# Patient Record
Sex: Female | Born: 1990 | Race: Black or African American | Hispanic: No | Smoking: Never smoker
Health system: Southern US, Community
[De-identification: ages and names within clinical notes are randomized; demographics above are authoritative.]

---

## 2014-08-02 ENCOUNTER — Emergency Department (HOSPITAL_COMMUNITY): Payer: Self-pay

## 2014-08-02 ENCOUNTER — Emergency Department (HOSPITAL_COMMUNITY)
Admission: EM | Admit: 2014-08-02 | Discharge: 2014-08-02 | Disposition: A | Discharge status: Home / Self Care | Payer: Self-pay | Attending: Emergency Medicine | Admitting: Emergency Medicine

## 2014-08-02 ENCOUNTER — Encounter (HOSPITAL_COMMUNITY): Payer: Self-pay | Admitting: Emergency Medicine

## 2014-08-02 DIAGNOSIS — Z3490 Encounter for supervision of normal pregnancy, unspecified, unspecified trimester: Secondary | ICD-10-CM

## 2014-08-02 DIAGNOSIS — O210 Mild hyperemesis gravidarum: Secondary | ICD-10-CM | POA: Insufficient documentation

## 2014-08-02 DIAGNOSIS — O9989 Other specified diseases and conditions complicating pregnancy, childbirth and the puerperium: Secondary | ICD-10-CM | POA: Insufficient documentation

## 2014-08-02 DIAGNOSIS — R1084 Generalized abdominal pain: Secondary | ICD-10-CM | POA: Insufficient documentation

## 2014-08-02 DIAGNOSIS — R197 Diarrhea, unspecified: Secondary | ICD-10-CM | POA: Insufficient documentation

## 2014-08-02 DIAGNOSIS — R112 Nausea with vomiting, unspecified: Secondary | ICD-10-CM

## 2014-08-02 LAB — COMPREHENSIVE METABOLIC PANEL
ALBUMIN: 3.9 g/dL (ref 3.5–5.2)
ALT: 8 U/L (ref 0–35)
ANION GAP: 15 (ref 5–15)
AST: 16 U/L (ref 0–37)
Alkaline Phosphatase: 32 U/L — ABNORMAL LOW (ref 39–117)
BILIRUBIN TOTAL: 0.4 mg/dL (ref 0.3–1.2)
BUN: 7 mg/dL (ref 6–23)
CO2: 21 mEq/L (ref 19–32)
CREATININE: 0.68 mg/dL (ref 0.50–1.10)
Calcium: 9.5 mg/dL (ref 8.4–10.5)
Chloride: 100 mEq/L (ref 96–112)
GFR calc Af Amer: >90 mL/min (ref >90)
GFR calc non Af Amer: >90 mL/min (ref >90)
Glucose, Bld: 100 mg/dL — ABNORMAL HIGH (ref 70–99)
Potassium: 3.8 mEq/L (ref 3.7–5.3)
Sodium: 136 mEq/L — ABNORMAL LOW (ref 137–147)
Total Protein: 7.5 g/dL (ref 6.0–8.3)

## 2014-08-02 LAB — CBC WITH DIFFERENTIAL/PLATELET
BASOS PCT: 0 % (ref 0–1)
Basophils Absolute: 0 10*3/uL (ref 0.0–0.1)
EOS ABS: 0.1 10*3/uL (ref 0.0–0.7)
Eosinophils Relative: 1 % (ref 0–5)
HEMATOCRIT: 39.6 % (ref 36.0–46.0)
HEMOGLOBIN: 14.1 g/dL (ref 12.0–15.0)
Lymphocytes Relative: 12 % (ref 12–46)
Lymphs Abs: 0.9 10*3/uL (ref 0.7–4.0)
MCH: 30.3 pg (ref 26.0–34.0)
MCHC: 35.6 g/dL (ref 30.0–36.0)
MCV: 85 fL (ref 78.0–100.0)
MONO ABS: 0.5 10*3/uL (ref 0.1–1.0)
Monocytes Relative: 6 % (ref 3–12)
Neutro Abs: 6.5 10*3/uL (ref 1.7–7.7)
Neutrophils Relative %: 81 % — ABNORMAL HIGH (ref 43–77)
Platelets: 314 10*3/uL (ref 150–400)
RBC: 4.66 MIL/uL (ref 3.87–5.11)
RDW: 12.3 % (ref 11.5–15.5)
WBC: 8 10*3/uL (ref 4.0–10.5)

## 2014-08-02 LAB — URINALYSIS, ROUTINE W REFLEX MICROSCOPIC
Glucose, UA: NEGATIVE mg/dL
NITRITE: NEGATIVE
Protein, ur: 30 mg/dL — AB
Specific Gravity, Urine: 1.02 (ref 1.005–1.030)
Urobilinogen, UA: 0.2 mg/dL (ref 0.0–1.0)
pH: 6 (ref 5.0–8.0)

## 2014-08-02 LAB — URINE MICROSCOPIC-ADD ON: RBC / HPF: NONE SEEN RBC/hpf (ref <3)

## 2014-08-02 LAB — HCG, QUANTITATIVE, PREGNANCY: hCG, Beta Chain, Quant, S: 27205 m[IU]/mL — ABNORMAL HIGH (ref <5)

## 2014-08-02 LAB — LIPASE, BLOOD: LIPASE: 15 U/L (ref 11–59)

## 2014-08-02 LAB — POC URINE PREG, ED: Preg Test, Ur: POSITIVE — AB

## 2014-08-02 MED ORDER — PROMETHAZINE HCL 25 MG PO TABS
25.0000 mg | ORAL_TABLET | ORAL | Status: DC
Start: 2014-08-02 — End: 2014-08-02
  Filled 2014-08-02: qty 1

## 2014-08-02 MED ORDER — PROMETHAZINE HCL 25 MG/ML IJ SOLN
25.0000 mg | Freq: Once | INTRAMUSCULAR | Status: AC
  Administered 2014-08-02: 25 mg via INTRAVENOUS
  Filled 2014-08-02: qty 1

## 2014-08-02 MED ORDER — PROMETHAZINE HCL 25 MG PO TABS: 25.0000 mg | ORAL_TABLET | Freq: Four times a day (QID) | ORAL | Status: AC | PRN

## 2014-08-02 MED ORDER — SODIUM CHLORIDE 0.9 % IV BOLUS (SEPSIS)
1000.0000 mL | Freq: Once | INTRAVENOUS | Status: AC
  Administered 2014-08-02: 1000 mL via INTRAVENOUS

## 2014-08-02 NOTE — Discharge Instructions (Signed)
Please follow up with your primary care physician in 1-2 days. If you do not have one please call the [facility] and wellness Center number listed above. Please follow up with an Ob/Gyn to schedule a follow up appointment in 10-14 days to have a repeat ultrasound.   Please read all discharge instructions and return precautions.   Viral Gastroenteritis Viral gastroenteritis is also known as stomach flu. This condition affects the stomach and intestinal tract. It can cause sudden diarrhea and vomiting. The illness typically lasts 3 to 8 days. Most people develop an immune response that eventually gets rid of the virus. While this natural response develops, the virus can make you quite ill. CAUSES  Many different viruses can cause gastroenteritis, such as rotavirus or noroviruses. You can catch one of these viruses by consuming contaminated food or water. You may also catch a virus by sharing utensils or other personal items with an infected person or by touching a contaminated surface. SYMPTOMS  The most common symptoms are diarrhea and vomiting. These problems can cause a severe loss of body fluids (dehydration) and a body salt (electrolyte) imbalance. Other symptoms may include:  Fever.  Headache.  Fatigue.  Abdominal pain. DIAGNOSIS  Your caregiver can usually diagnose viral gastroenteritis based on your symptoms and a physical exam. A stool sample may also be taken to test for the presence of viruses or other infections. TREATMENT  This illness typically goes away on its own. Treatments are aimed at rehydration. The most serious cases of viral gastroenteritis involve vomiting so severely that you are not able to keep fluids down. In these cases, fluids must be given through an intravenous line (IV). HOME CARE INSTRUCTIONS   Drink enough fluids to keep your urine clear or pale yellow. Drink small amounts of fluids frequently and increase the amounts as tolerated.  Ask your caregiver for  specific rehydration instructions.  Avoid:  Foods high in sugar.  Alcohol.  Carbonated drinks.  Tobacco.  Juice.  Caffeine drinks.  Extremely hot or cold fluids.  Fatty, greasy foods.  Too much intake of anything at one time.  Dairy products until 24 to 48 hours after diarrhea stops.  You may consume probiotics. Probiotics are active cultures of beneficial bacteria. They may lessen the amount and number of diarrheal stools in adults. Probiotics can be found in yogurt with active cultures and in supplements.  Wash your hands well to avoid spreading the virus.  Only take over-the-counter or prescription medicines for pain, discomfort, or fever as directed by your caregiver. Do not give aspirin to children. Antidiarrheal medicines are not recommended.  Ask your caregiver if you should continue to take your regular prescribed and over-the-counter medicines.  Keep all follow-up appointments as directed by your caregiver. SEEK IMMEDIATE MEDICAL CARE IF:   You are unable to keep fluids down.  You do not urinate at least once every 6 to 8 hours.  You develop shortness of breath.  You notice blood in your stool or vomit. This may look like coffee grounds.  You have abdominal pain that increases or is concentrated in one small area (localized).  You have persistent vomiting or diarrhea.  You have a fever.  The patient is a child younger than 3 months, and he or she has a fever.  The patient is a child older than 3 months, and he or she has a fever and persistent symptoms.  The patient is a child older than 3 months, and he or she  has a fever and symptoms suddenly get worse.  The patient is a baby, and he or she has no tears when crying. MAKE SURE YOU:   Understand these instructions.  Will watch your condition.  Will get help right away if you are not doing well or get worse. Document Released: 11/02/2005 Document Revised: 01/25/2012 Document Reviewed:  08/19/2011 [facility] Patient Information 2015 [place], [place]. This information is not intended to replace advice given to you by your health care provider. Make sure you discuss any questions you have with your health care provider. Abdominal Pain During Pregnancy Abdominal pain is common in pregnancy. Most of the time, it does not cause harm. There are many causes of abdominal pain. Some causes are more serious than others. Some of the causes of abdominal pain in pregnancy are easily diagnosed. Occasionally, the diagnosis takes time to understand. Other times, the cause is not determined. Abdominal pain can be a sign that something is very wrong with the pregnancy, or the pain may have nothing to do with the pregnancy at all. For this reason, always tell your health care provider if you have any abdominal discomfort. HOME CARE INSTRUCTIONS  Monitor your abdominal pain for any changes. The following actions may help to alleviate any discomfort you are experiencing:  Do not have sexual intercourse or put anything in your vagina until your symptoms go away completely.  Get plenty of rest until your pain improves.  Drink clear fluids if you feel nauseous. Avoid solid food as long as you are uncomfortable or nauseous.  Only take over-the-counter or prescription medicine as directed by your health care provider.  Keep all follow-up appointments with your health care provider. SEEK IMMEDIATE MEDICAL CARE IF:  You are bleeding, leaking fluid, or passing tissue from the vagina.  You have increasing pain or cramping.  You have persistent vomiting.  You have painful or bloody urination.  You have a fever.  You notice a decrease in your baby's movements.  You have extreme weakness or feel faint.  You have shortness of breath, with or without abdominal pain.  You develop a severe headache with abdominal pain.  You have abnormal vaginal discharge with abdominal pain.  You have persistent  diarrhea.  You have abdominal pain that continues even after rest, or gets worse. MAKE SURE YOU:   Understand these instructions.  Will watch your condition.  Will get help right away if you are not doing well or get worse. Document Released: 11/02/2005 Document Revised: 08/23/2013 Document Reviewed: 06/01/2013 [facility] Patient Information 2015 [place], [place]. This information is not intended to replace advice given to you by your health care provider. Make sure you discuss any questions you have with your health care provider.

## 2014-08-02 NOTE — ED Provider Notes (Signed)
CSN: [id number]     Arrival date & time 08/02/14  1137 History   First MD Initiated Contact with Patient 08/02/14 1217     Chief Complaint  Patient presents with  . Abdominal Cramping     (Consider location/radiation/quality/duration/timing/severity/associated sxs/prior Treatment) HPI Comments: Patient is a 23 yo F presenting to the ED for four days of nausea, non-bloody non-bilious emesis, and non-bloody diarrhea with generalized abdominal cramping pain worse at nighttime. Patient states that her diarrhea and vomiting have improved today. Endorses decreased PO intake. She states she had one day of light spotting, but denies any isolated pelvic pain or cramping. LMP 06/04/14.   Patient is a 23 y.o. female presenting with cramps.  Abdominal Cramping Associated symptoms: diarrhea, nausea and vomiting     History reviewed. No pertinent past medical history. History reviewed. No pertinent past surgical history. No family history on file. History  Substance Use Topics  . Smoking status: Never Smoker   . Smokeless tobacco: Not on file  . Alcohol Use: No   OB History   Grav Para Term Preterm Abortions TAB SAB Ect Mult Living                 Review of Systems  Gastrointestinal: Positive for nausea, vomiting, abdominal pain and diarrhea.  All other systems reviewed and are negative.     Allergies  Review of patient's allergies indicates no known allergies.  Home Medications   Prior to Admission medications   Medication Sig Start Date End Date Taking? Authorizing Provider  promethazine (PHENERGAN) 25 MG tablet Take 1 tablet (25 mg total) by mouth every 6 (six) hours as needed for nausea or vomiting. 08/02/14   Victorino Dike L Jadie Allington, PA-C   BP 118/71  Pulse 97  Temp(Src) 97.6 F (36.4 C) (Oral)  Resp 18  Ht  (1.727 m)  Wt 135 lb (61.236 kg)  BMI 20.53 kg/m2  SpO2 100%  LMP 06/04/2014 Physical Exam  Nursing note and vitals reviewed. Constitutional: She is oriented  to person, place, and time. She appears well-developed and well-nourished. No distress.  HENT:  Head: Normocephalic and atraumatic.  Right Ear: External ear normal.  Left Ear: External ear normal.  Nose: Nose normal.  Eyes: Conjunctivae are normal.  Neck: Neck supple.  Cardiovascular: Normal rate, regular rhythm and normal heart sounds.   Abdominal: Soft. Bowel sounds are normal. She exhibits no distension. There is no tenderness. There is no rebound and no guarding.  Neurological: She is alert and oriented to person, place, and time.  Skin: Skin is warm and dry. She is not diaphoretic.    ED Course  Procedures (including critical care time) Medications  sodium chloride 0.9 % bolus 1,000 mL (1,000 mLs Intravenous New Bag/Given 08/02/14 1340)  promethazine (PHENERGAN) injection 25 mg (25 mg Intravenous Given 08/02/14 1342)    Labs Review Labs Reviewed  CBC WITH DIFFERENTIAL - Abnormal; Notable for the following:    Neutrophils Relative % 81 (*)    All other components within normal limits  COMPREHENSIVE METABOLIC PANEL - Abnormal; Notable for the following:    Sodium 136 (*)    Glucose, Bld 100 (*)    Alkaline Phosphatase 32 (*)    All other components within normal limits  URINALYSIS, ROUTINE W REFLEX MICROSCOPIC - Abnormal; Notable for the following:    APPearance HAZY (*)    Hgb urine dipstick TRACE (*)    Bilirubin Urine SMALL (*)    Ketones, ur >80 (*)  Protein, ur 30 (*)    Leukocytes, UA TRACE (*)    All other components within normal limits  HCG, QUANTITATIVE, PREGNANCY - Abnormal; Notable for the following:    hCG, Beta Chain, [place] [address] (*)    All other components within normal limits  URINE MICROSCOPIC-ADD ON - Abnormal; Notable for the following:    Squamous Epithelial / LPF MANY (*)    Bacteria, UA FEW (*)    All other components within normal limits  POC URINE PREG, ED - Abnormal; Notable for the following:    Preg Test, Ur POSITIVE (*)    All other  components within normal limits  URINE CULTURE  LIPASE, BLOOD    Imaging Review US Ob Comp Less 14 Wks  08/02/2014   CLINICAL DATA:  Positive pregnancy test, vaginal bleeding  EXAM: OBSTETRIC <14 WK [place] AND TRANSVAGINAL OB US  TECHNIQUE: Both transabdominal and transvaginal ultrasound examinations were performed for complete evaluation of the gestation as well as the maternal uterus, adnexal regions, and pelvic cul-de-sac. Transvaginal technique was performed to assess early pregnancy.  COMPARISON:  None.  FINDINGS: Intrauterine gestational sac: Visualized, mildly elongated in shape  Yolk sac:  Not visualized  Embryo:  Not visualized  Cardiac Activity: No embryo seen  MSD: 20 mm   6 w   6  d       [place] EDC: 03/22/15  Maternal uterus/adnexae: Right ovarian corpus luteum noted. Left ovary is normal. Small free fluid in the cul-de-sac.  IMPRESSION: Intrauterine gestational sac but no fetal pole, yolk sac, or cardiac activity yet visualized. Findings are suspicious but not yet definitive for failed pregnancy. Recommend follow-up US in 10-14 days for definitive diagnosis. This recommendation follows SRU consensus guidelines: Diagnostic Criteria for Nonviable Pregnancy Early in the First Trimester. Malva Limes Med 2013; 161:0960-45.   Electronically Signed   By: Christiana Pellant M.D.   On: 08/02/2014 15:05   US Ob Transvaginal  08/02/2014   CLINICAL DATA:  Positive pregnancy test, vaginal bleeding  EXAM: OBSTETRIC <14 WK [place] AND TRANSVAGINAL OB US  TECHNIQUE: Both transabdominal and transvaginal ultrasound examinations were performed for complete evaluation of the gestation as well as the maternal uterus, adnexal regions, and pelvic cul-de-sac. Transvaginal technique was performed to assess early pregnancy.  COMPARISON:  None.  FINDINGS: Intrauterine gestational sac: Visualized, mildly elongated in shape  Yolk sac:  Not visualized  Embryo:  Not visualized  Cardiac Activity: No embryo seen  MSD: 20 mm   6 w   6  d       [place]  EDC: 03/22/15  Maternal uterus/adnexae: Right ovarian corpus luteum noted. Left ovary is normal. Small free fluid in the cul-de-sac.  IMPRESSION: Intrauterine gestational sac but no fetal pole, yolk sac, or cardiac activity yet visualized. Findings are suspicious but not yet definitive for failed pregnancy. Recommend follow-up US in 10-14 days for definitive diagnosis. This recommendation follows SRU consensus guidelines: Diagnostic Criteria for Nonviable Pregnancy Early in the First Trimester. Malva Limes Med 2013; 409:8119-14.   Electronically Signed   By: Christiana Pellant M.D.   On: 08/02/2014 15:05     EKG Interpretation None      Based on LMP approximate gestational age is [redacted]w[redacted]d MDM   Final diagnoses:  Nausea vomiting and diarrhea  Generalized abdominal pain  Pregnancy    Filed Vitals:   08/02/14 1143  BP: 118/71  Pulse: 97  Temp: 97.6 F (36.4 C)  Resp: 18  Afebrile, NAD, non-toxic appearing, AAOx4.   1) Nausea, vomiting, diarrhea: Patient with symptoms consistent with viral gastroenteritis.  Vitals are stable, no fever.  No signs of dehydration, tolerating PO fluids > 6 oz.  Lungs are clear.  No focal abdominal pain, no concern for appendicitis, cholecystitis, pancreatitis, ruptured viscus, UTI, kidney stone, or any other abdominal etiology.   2) Pregnancy: Patient with one episode of scant spotting. Pregnancy noted on ua and quant. US obtained and suspicious for failed first trimester pregnancy, yolk confirmed intrauterine w/o visualized cardiac activity. Discussed with patient that she will need to follow up in 10-14 days with ob/gyn for repeat ultrasound for re-evaluation.   Return precautions discussed. Patient is agreeable to plan. Patient is stable at time of discharge  Patient d/w with Dr. Wilkie Aye, agrees with plan.      Jeannetta Ellis, PA-C 08/02/14 1607

## 2014-08-02 NOTE — ED Notes (Signed)
Pt reports 7/10 lower abdominal cramping with N/V x 4 days. Denies fever/chills. Denies CP/SOB. LMP 7/20.

## 2014-08-03 NOTE — ED Provider Notes (Signed)
Medical screening examination/treatment/procedure(s) were performed by non-physician practitioner and as supervising physician I was immediately available for consultation/collaboration.   EKG Interpretation None        Courtney F Horton, MD 08/03/14 1048 

## 2014-08-04 LAB — URINE CULTURE: Colony Count: >=100000

## 2015-09-27 IMAGING — US US OB TRANSVAGINAL
1 series · 14 of 28 positions shown · non-contrast
Comparison: None.

CLINICAL DATA: Positive pregnancy test, vaginal bleeding

EXAM:
OBSTETRIC <14 WK US AND TRANSVAGINAL OB US
TECHNIQUE: Both transabdominal and transvaginal ultrasound examinations were
performed for complete evaluation of the gestation as well as the
maternal uterus, adnexal regions, and pelvic cul-de-sac.
Transvaginal technique was performed to assess early pregnancy.

[Series 1: us ob transvaginal · 0.17mm/px · 14 of 69 slices shown]
[im 3/69]
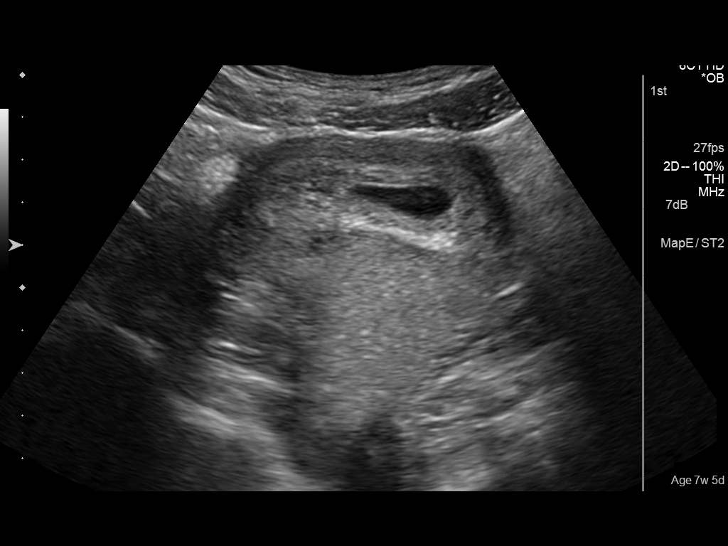
[im 8/69]
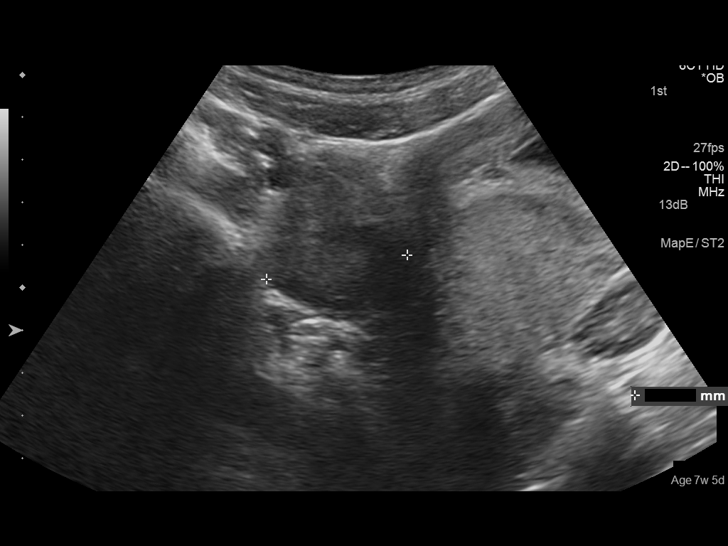
[im 13/69]
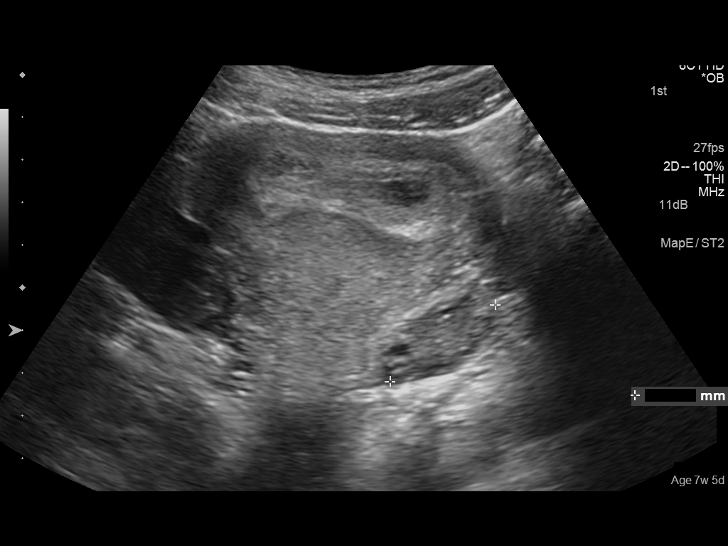
[im 18/69]
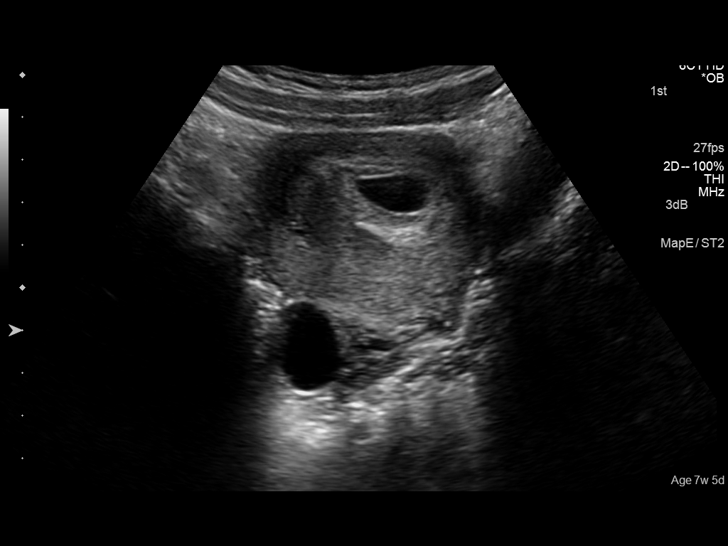
[im 23/69]
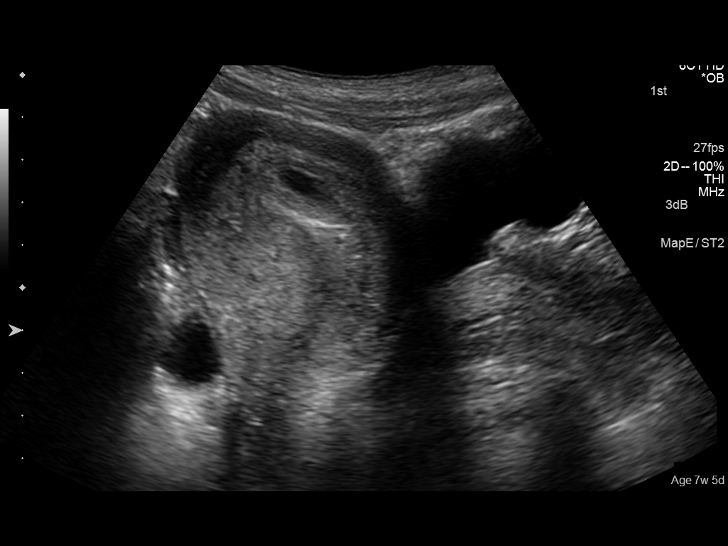
[im 28/69]
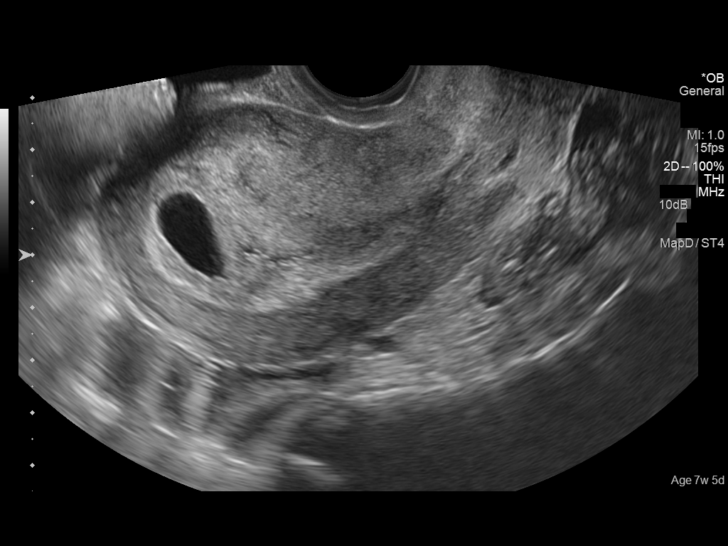
[im 33/69]
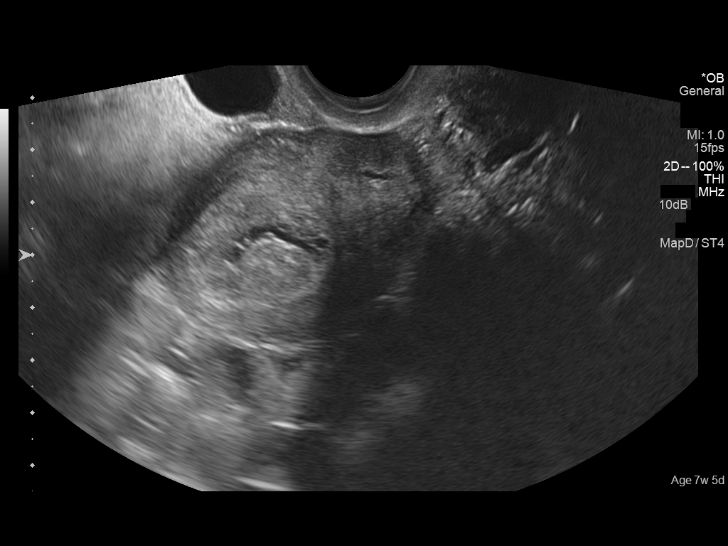
[im 38/69]
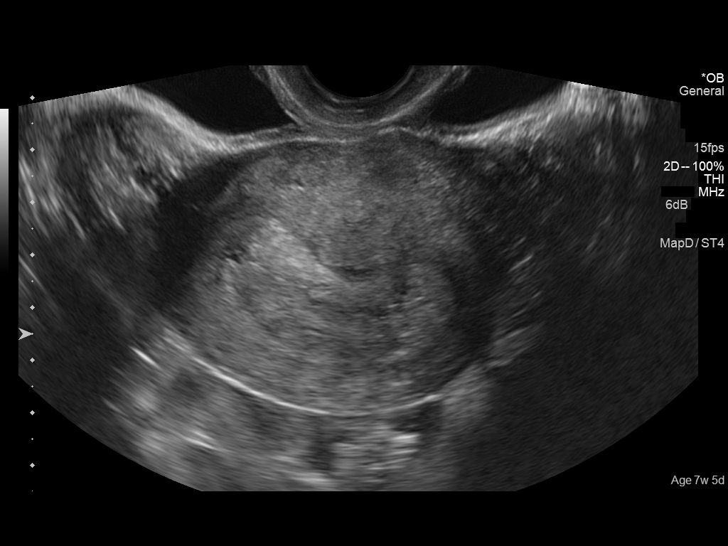
[im 43/69]
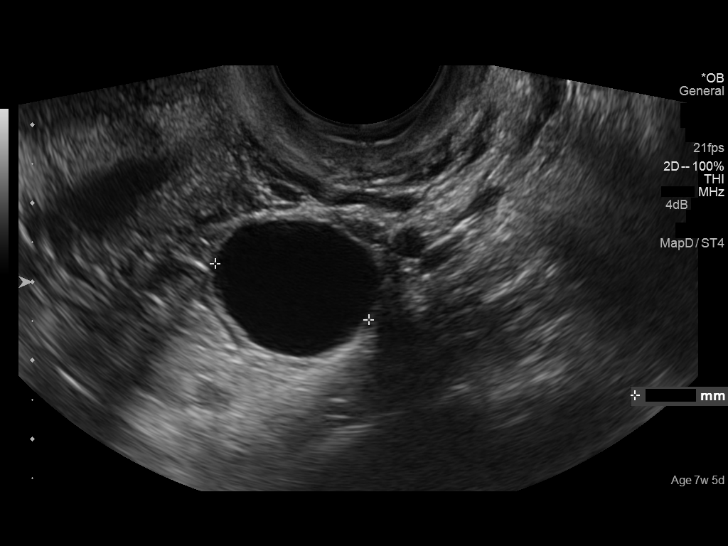
[im 48/69]
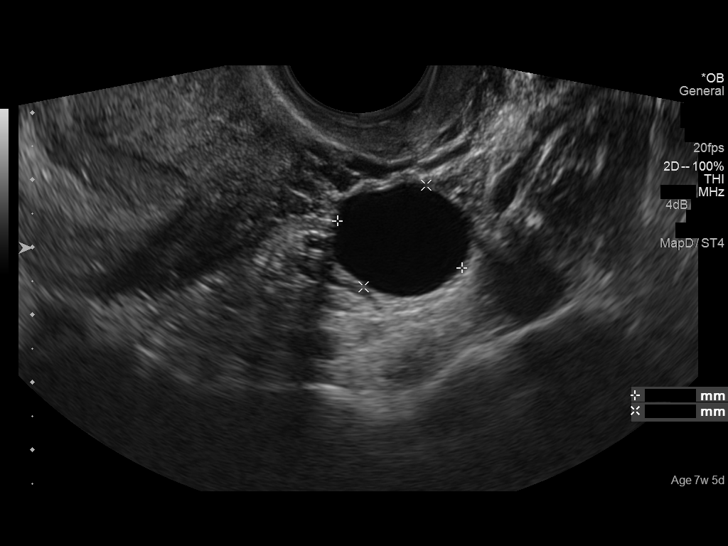
[im 53/69]
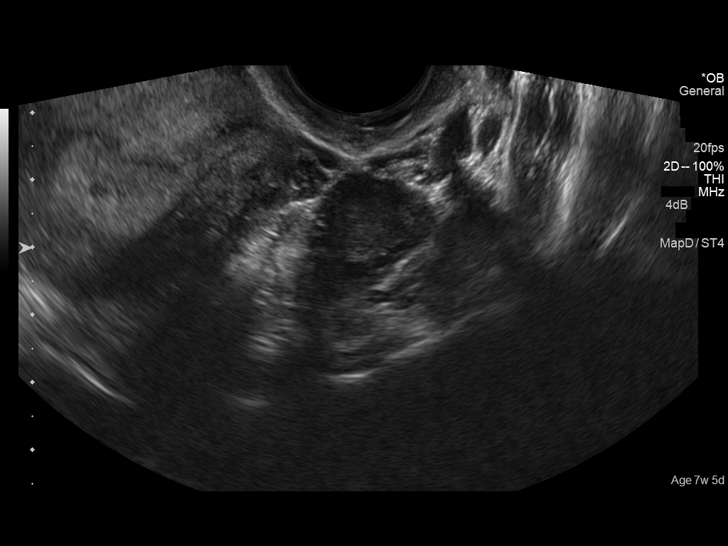
[im 58/69]
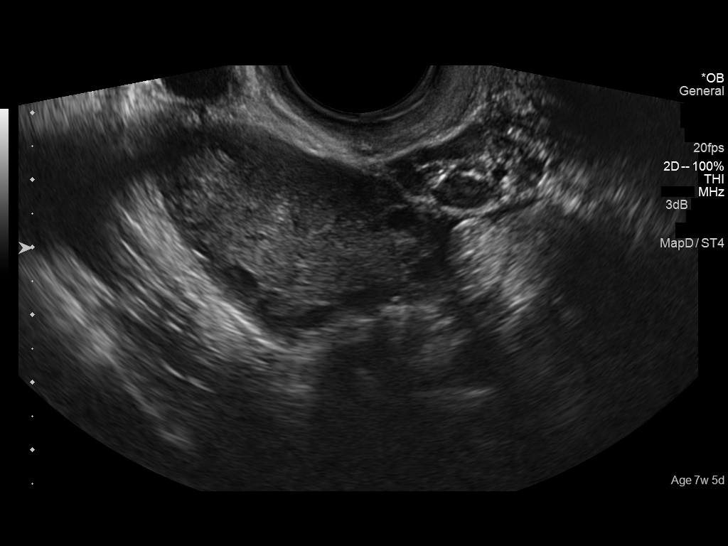
[im 63/69]
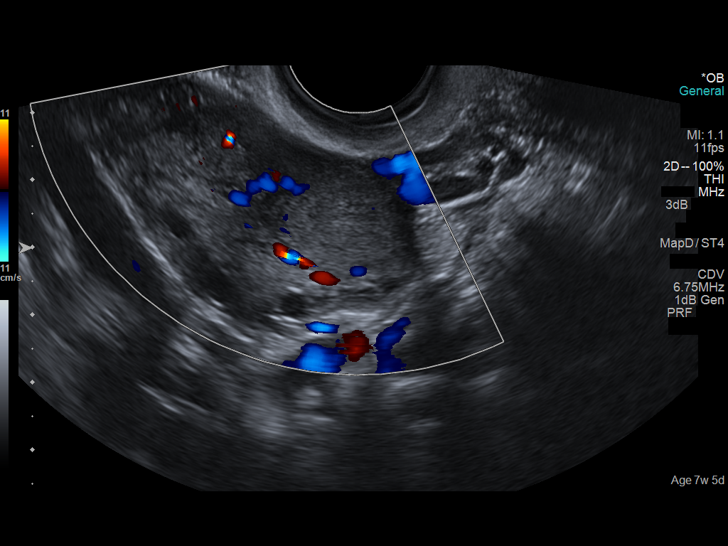
[im 69/69]
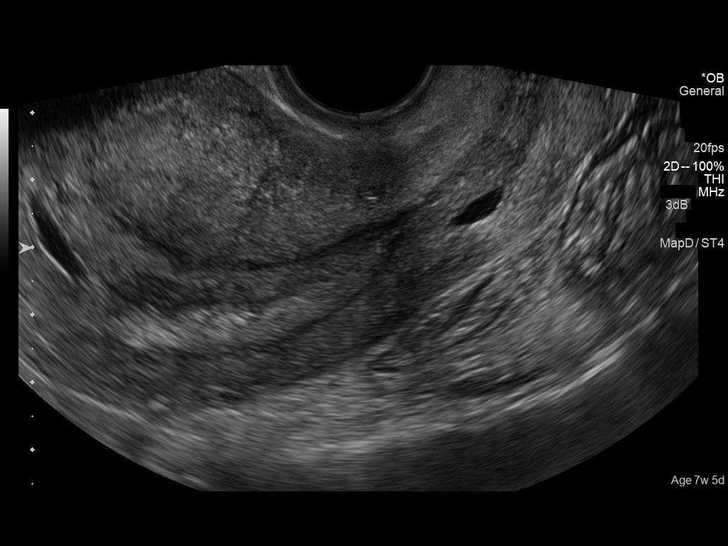

[14 of 28 positions shown; findings below may reference images not displayed]

FINDINGS: Intrauterine gestational sac: Visualized, mildly elongated in shape

Yolk sac:  Not visualized

Embryo:  Not visualized

Cardiac Activity: No embryo seen

MSD: 20 mm   6 w   6  d       US EDC: 03/22/15

Maternal uterus/adnexae: Right ovarian corpus luteum noted. Left
ovary is normal. Small free fluid in the cul-de-sac.
IMPRESSION: Intrauterine gestational sac but no fetal pole, yolk sac, or cardiac
activity yet visualized. Findings are suspicious but not yet
definitive for failed pregnancy. Recommend follow-up US in 10-14
days for definitive diagnosis. This recommendation follows SRU
consensus guidelines: Diagnostic Criteria for Nonviable Pregnancy
Early in the First Trimester. N Engl J Med 4633; [DATE].

## 2019-07-04 ENCOUNTER — Other Ambulatory Visit: Payer: Self-pay

## 2019-07-04 ENCOUNTER — Encounter (HOSPITAL_COMMUNITY): Payer: Self-pay

## 2019-07-04 ENCOUNTER — Emergency Department (HOSPITAL_COMMUNITY)
Admission: EM | Admit: 2019-07-04 | Discharge: 2019-07-05 | Disposition: A | Discharge status: Home / Self Care | Payer: Self-pay | Attending: Emergency Medicine | Admitting: Emergency Medicine

## 2019-07-04 DIAGNOSIS — R1032 Left lower quadrant pain: Secondary | ICD-10-CM | POA: Insufficient documentation

## 2019-07-04 DIAGNOSIS — Z3A01 Less than 8 weeks gestation of pregnancy: Secondary | ICD-10-CM | POA: Insufficient documentation

## 2019-07-04 DIAGNOSIS — O9989 Other specified diseases and conditions complicating pregnancy, childbirth and the puerperium: Secondary | ICD-10-CM | POA: Insufficient documentation

## 2019-07-04 LAB — URINALYSIS, ROUTINE W REFLEX MICROSCOPIC
Bilirubin Urine: NEGATIVE
Glucose, UA: NEGATIVE mg/dL
Hgb urine dipstick: NEGATIVE
Ketones, ur: NEGATIVE mg/dL
Leukocytes,Ua: NEGATIVE
Nitrite: NEGATIVE
Protein, ur: NEGATIVE mg/dL
Specific Gravity, Urine: 1.017 (ref 1.005–1.030)
pH: 6 (ref 5.0–8.0)

## 2019-07-04 LAB — COMPREHENSIVE METABOLIC PANEL
ALT: 15 U/L (ref 0–44)
AST: 17 U/L (ref 15–41)
Albumin: 3.8 g/dL (ref 3.5–5.0)
Alkaline Phosphatase: 29 U/L — ABNORMAL LOW (ref 38–126)
Anion gap: 9 (ref 5–15)
BUN: 5 mg/dL — ABNORMAL LOW (ref 6–20)
CO2: 24 mmol/L (ref 22–32)
Calcium: 9.7 mg/dL (ref 8.9–10.3)
Chloride: 103 mmol/L (ref 98–111)
Creatinine, Ser: 0.69 mg/dL (ref 0.44–1.00)
GFR calc Af Amer: >60 mL/min (ref >60)
GFR calc non Af Amer: >60 mL/min (ref >60)
Glucose, Bld: 71 mg/dL (ref 70–99)
Potassium: 3.6 mmol/L (ref 3.5–5.1)
Sodium: 136 mmol/L (ref 135–145)
Total Bilirubin: 0.3 mg/dL (ref 0.3–1.2)
Total Protein: 7.2 g/dL (ref 6.5–8.1)

## 2019-07-04 LAB — CBC
HCT: 41.5 % (ref 36.0–46.0)
Hemoglobin: 14.3 g/dL (ref 12.0–15.0)
MCH: 31 pg (ref 26.0–34.0)
MCHC: 34.5 g/dL (ref 30.0–36.0)
MCV: 89.8 fL (ref 80.0–100.0)
Platelets: 309 10*3/uL (ref 150–400)
RBC: 4.62 MIL/uL (ref 3.87–5.11)
RDW: 12.5 % (ref 11.5–15.5)
WBC: 9.2 10*3/uL (ref 4.0–10.5)
nRBC: 0 % (ref 0.0–0.2)

## 2019-07-04 LAB — LIPASE, BLOOD: Lipase: 26 U/L (ref 11–51)

## 2019-07-04 LAB — HCG, QUANTITATIVE, PREGNANCY: hCG, Beta Chain, Quant, S: 166791 m[IU]/mL — ABNORMAL HIGH (ref <5)

## 2019-07-04 LAB — I-STAT BETA HCG BLOOD, ED (MC, WL, AP ONLY): I-stat hCG, quantitative: >2000 m[IU]/mL — ABNORMAL HIGH (ref <5)

## 2019-07-04 MED ORDER — SODIUM CHLORIDE 0.9% FLUSH: 3.0000 mL | Freq: Once | INTRAVENOUS | Status: DC

## 2019-07-04 NOTE — ED Triage Notes (Addendum)
Pt c/o abdominal cramping x1 week associated with spotting. Pt is not on menstrual cycle but is unsure when last period was. N/V no D. Pt has not taken pregnancy test.

## 2019-07-04 NOTE — ED Notes (Signed)
Urine and culture sent to lab  

## 2019-07-05 ENCOUNTER — Emergency Department (HOSPITAL_COMMUNITY): Payer: Self-pay

## 2019-07-05 MED ORDER — ONDANSETRON 4 MG PO TBDP: 4.0000 mg | ORAL_TABLET | Freq: Three times a day (TID) | ORAL | 0 refills | Status: AC | PRN

## 2019-07-05 MED ORDER — ONDANSETRON 4 MG PO TBDP
4.0000 mg | ORAL_TABLET | Freq: Once | ORAL | Status: AC
  Administered 2019-07-05: 01:00:00 4 mg via ORAL
  Filled 2019-07-05: qty 1

## 2019-07-05 NOTE — ED Notes (Signed)
Pt ambulated to RR without assistance.

## 2019-07-05 NOTE — ED Provider Notes (Signed)
[facility]EM[facility]ENC[facility]PT Provider Note  CSN: [id number] Arrival date & time: 07/04/19 2134  Chief Complaint(s) Abdominal Pain  HPI Dominique Cardenas is a 28 y.o. female   Intermittent LLQ abd sharp pain for 1 week. Lasting 1-2 hours at a time. Self resolves. Walking helps. BM helps. Eating brings the pain on. Having nausea and NBNB emesis. Having vaginal spotting for 3 days. No discharge. No fevers or chills. No dysuria, diarrhea or constipation.   HPI  Past Medical History History reviewed. No pertinent past medical history. There are no active problems to display for this patient.  Home Medication(s) Prior to Admission medications   Medication Sig Start Date End Date Taking? Authorizing Provider  promethazine (PHENERGAN) 25 MG tablet Take 1 tablet (25 mg total) by mouth every 6 (six) hours as needed for nausea or vomiting. 08/02/14   Francee PiccoloPiepenbrink, Jennifer, PA-C                                                                                                                                    Past Surgical History History reviewed. No pertinent surgical history. Family History No family history on file.  Social History Social History   Tobacco Use  . Smoking status: Never Smoker  Substance Use Topics  . Alcohol use: No  . Drug use: No   Allergies Patient has no known allergies.  Review of Systems Review of Systems All other systems are reviewed and are negative for acute change except as noted in the HPI  Physical Exam Vital Signs  I have reviewed the triage vital signs BP (!) 106/93 (BP Location: Left Arm)   Pulse 86   Temp 99 F (37.2 C) (Oral)   Resp 15   Ht 5\' 5"  (1.651 m)   Wt 70.3 kg   LMP  (LMP Unknown)   SpO2 99%   BMI 25.79 kg/m   Physical Exam Vitals signs reviewed.  Constitutional:      General: She is not in acute distress.    Appearance: She is well-developed. She is not diaphoretic.  HENT:     Head:  Normocephalic and atraumatic.     Right Ear: External ear normal.     Left Ear: External ear normal.     Nose: Nose normal.  Eyes:     General: No scleral icterus.    Conjunctiva/sclera: Conjunctivae normal.  Neck:     Musculoskeletal: Normal range of motion.     Trachea: Phonation normal.  Cardiovascular:     Rate and Rhythm: Normal rate and regular rhythm.  Pulmonary:     Effort: Pulmonary effort is normal. No respiratory distress.     Breath sounds: No stridor.  Abdominal:     General: There is no distension.     Tenderness: There is abdominal tenderness (mild discomfort) in the left lower quadrant. There is no guarding or rebound.  Musculoskeletal: Normal range of motion.  Neurological:     Mental Status: She is alert and oriented to person, place, and time.  Psychiatric:        Behavior: Behavior normal.     ED Results and Treatments Labs (all labs ordered are listed, but only abnormal results are displayed) Labs Reviewed  COMPREHENSIVE METABOLIC PANEL - Abnormal; Notable for the following components:      Result Value   BUN 5 (*)    Alkaline Phosphatase 29 (*)    All other components within normal limits  URINALYSIS, ROUTINE W REFLEX MICROSCOPIC - Abnormal; Notable for the following components:   APPearance HAZY (*)    All other components within normal limits  HCG, QUANTITATIVE, PREGNANCY - Abnormal; Notable for the following components:   hCG, Beta Chain, Quant, S [id number] (*)    All other components within normal limits  I-STAT BETA HCG BLOOD, ED ([facility], [facility], [facility] ONLY) - Abnormal; Notable for the following components:   I-stat hCG, quantitative >2,000.0 (*)    All other components within normal limits  LIPASE, BLOOD  CBC                                                                                                                         EKG  EKG Interpretation  Date/Time:    Ventricular Rate:    PR Interval:    QRS Duration:   QT Interval:    QTC  Calculation:   R Axis:     Text Interpretation:        Radiology US Ob Comp < 14 Wks  Result Date: 07/05/2019 CLINICAL DATA:  Initial evaluation for acute pelvic cramping, vaginal bleeding. EXAM: OBSTETRIC <14 WK ULTRASOUND TECHNIQUE: Transabdominal ultrasound was performed for evaluation of the gestation as well as the maternal uterus and adnexal regions. COMPARISON:  None available. FINDINGS: Intrauterine gestational sac: Single Yolk sac:  Present Embryo:  Present Cardiac Activity: Present Heart Rate: 143 bpm Delete that CRL:  13.7 mm 7 w 5 d                  [place] EDC: 02/15/2020. Subchorionic hemorrhage:  None visualized. Maternal uterus/adnexae: Ovaries are normal in appearance bilaterally. No adnexal mass or free fluid. IMPRESSION: 1. Single viable intrauterine pregnancy as above without complication, estimated gestational age [redacted] weeks and 5 days by crown-rump length, with ultrasound EDC of 02/15/2020. 2. No other acute maternal uterine or adnexal abnormality identified. Electronically Signed   By: Jeannine Boga M.D.   On: 07/05/2019 01:45    Pertinent labs & imaging results that were available during my care of the patient were reviewed by me and considered in my medical decision making (see chart for details).  Medications Ordered in ED Medications  sodium chloride flush (NS) 0.9 % injection 3 mL (has no administration in time range)  ondansetron (ZOFRAN-ODT) disintegrating tablet 4 mg (4 mg Oral Given 07/05/19 0054)  Procedures Procedures  (including critical care time)  Medical Decision Making / ED Course I have reviewed the nursing notes for this encounter and the patient's prior records (if available in EHR or on provided paperwork).   Dominique HasBrooke H Cardenas was evaluated in Emergency Department on 07/05/2019 for the symptoms described in the history of  present illness. She was evaluated in the context of the global COVID-19 pandemic, which necessitated consideration that the patient might be at risk for infection with the SARS-CoV-2 virus that causes COVID-19. Institutional protocols and algorithms that pertain to the evaluation of patients at risk for COVID-19 are in a state of rapid change based on information released by regulatory bodies including the CDC and federal and state organizations. These policies and algorithms were followed during the patient's care in the ED.  Patient noted to be pregnant.  Rest of the labs grossly reassuring.  No evidence of urinary tract infection.  Pelvic ultrasound notable for IUP at 7 weeks and 5 days.  She already has follow-up appointment with OB Gyn.  No suspicion for other serious intra-abdominal Fama to assess infectious process requiring imaging at this time.  The patient appears reasonably screened and/or stabilized for discharge and I doubt any other medical condition or other [facility] requiring further screening, evaluation, or treatment in the ED at this time prior to discharge.  The patient is safe for discharge with strict return precautions. .        Final Clinical Impression(s) / ED Diagnoses Final diagnoses:  [redacted] weeks gestation of pregnancy      The patient appears reasonably screened and/or stabilized for discharge and I doubt any other medical condition or other [facility] requiring further screening, evaluation, or treatment in the ED at this time prior to discharge.  Disposition: Discharge  Condition: Good  I have discussed the results, Dx and Tx plan with the patient who expressed understanding and agree(s) with the plan. Discharge instructions discussed at great length. The patient was given strict return precautions who verbalized understanding of the instructions. No further questions at time of discharge.    ED Discharge Orders    None        Follow Up: Obstetrician        This chart was dictated using voice recognition software.  Despite best efforts to proofread,  errors can occur which can change the documentation meaning.   Nira Connardama, Pedro Eduardo, MD 07/05/19 [phone]

## 2019-12-18 DEATH — deceased

## 2020-07-17 ENCOUNTER — Other Ambulatory Visit: Payer: Medicaid Other

## 2020-07-17 ENCOUNTER — Other Ambulatory Visit: Payer: Self-pay

## 2020-07-17 DIAGNOSIS — Z20822 Contact with and (suspected) exposure to covid-19: Secondary | ICD-10-CM

## 2020-07-19 LAB — NOVEL CORONAVIRUS, NAA: SARS-CoV-2, NAA: NOT DETECTED

## 2020-08-29 IMAGING — US OBSTETRIC <14 WK ULTRASOUND
1 series · 14 of 28 positions shown · non-contrast
Comparison: None available.

CLINICAL DATA: Initial evaluation for acute pelvic cramping,
vaginal bleeding.

EXAM:
OBSTETRIC <14 WK ULTRASOUND
TECHNIQUE: Transabdominal ultrasound was performed for evaluation of the
gestation as well as the maternal uterus and adnexal regions.

[Series 1: obstetric <14 wk ultrasound · 14 of 50 slices shown]
[im 2/50]
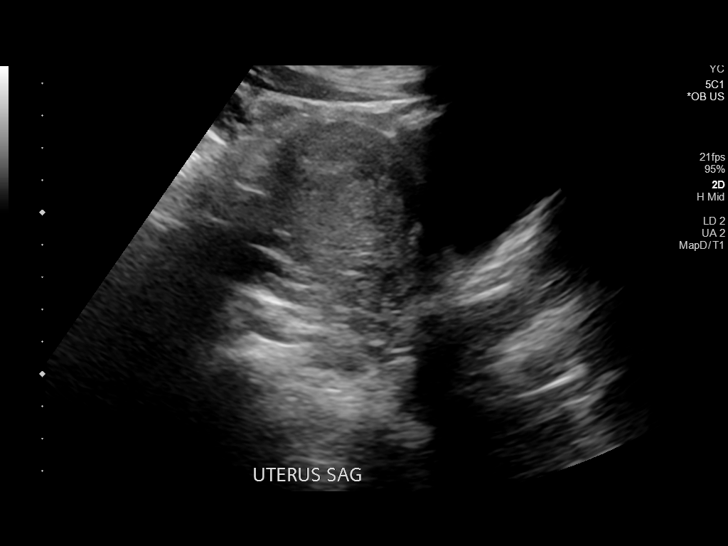
[im 6/50]
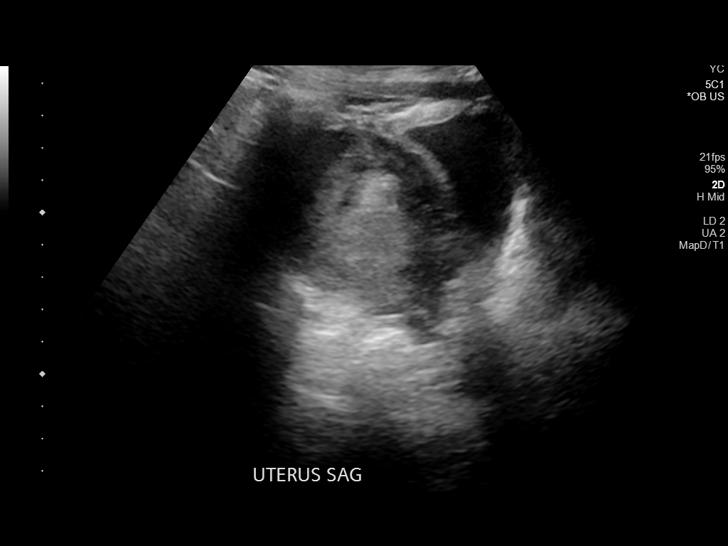
[im 10/50]
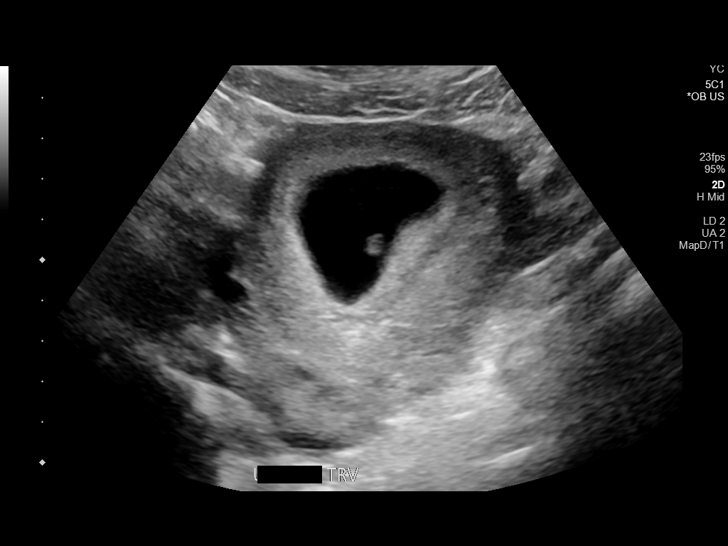
[im 13/50]
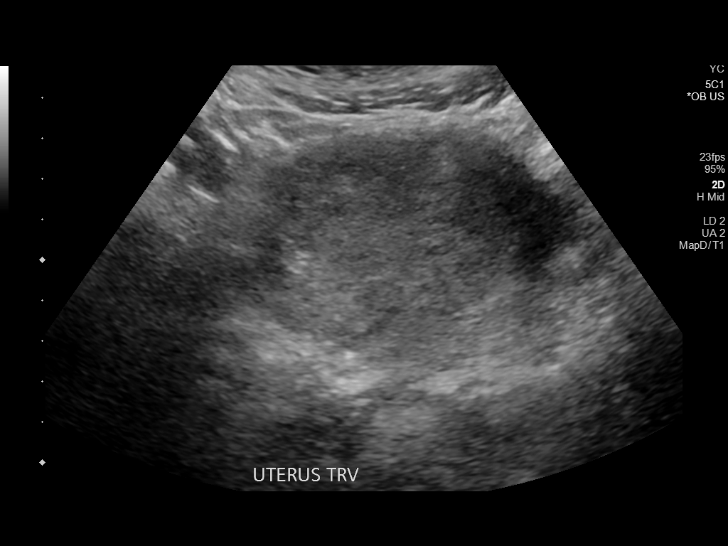
[im 17/50]
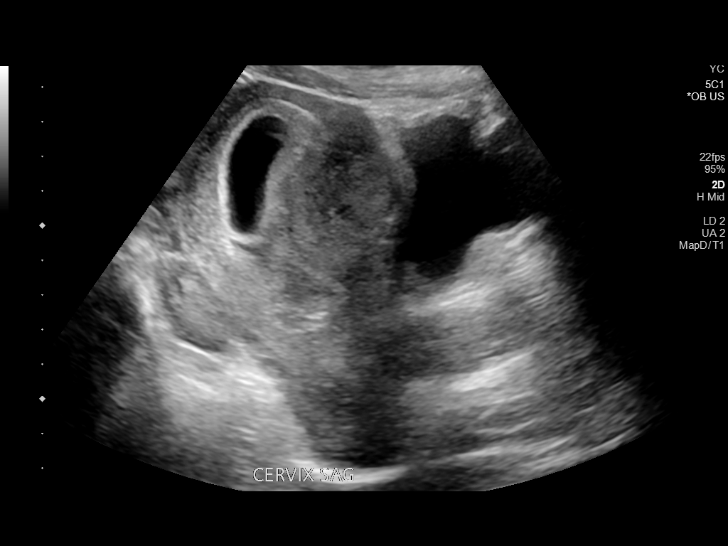
[im 20/50]
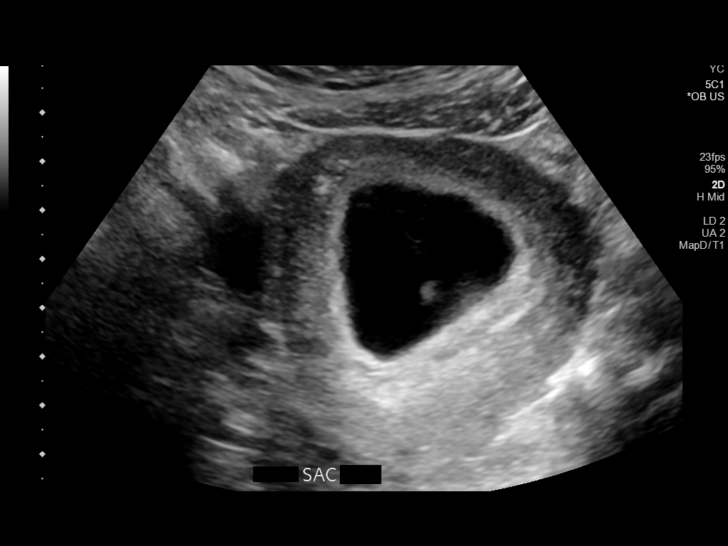
[im 24/50]
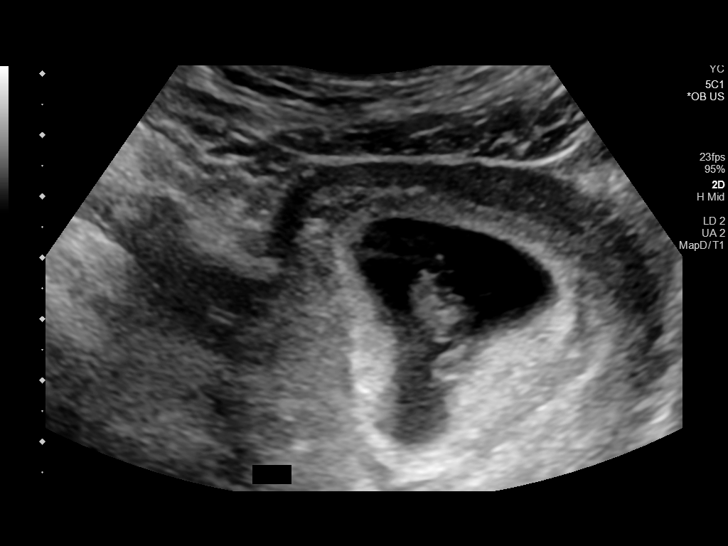
[im 28/50]
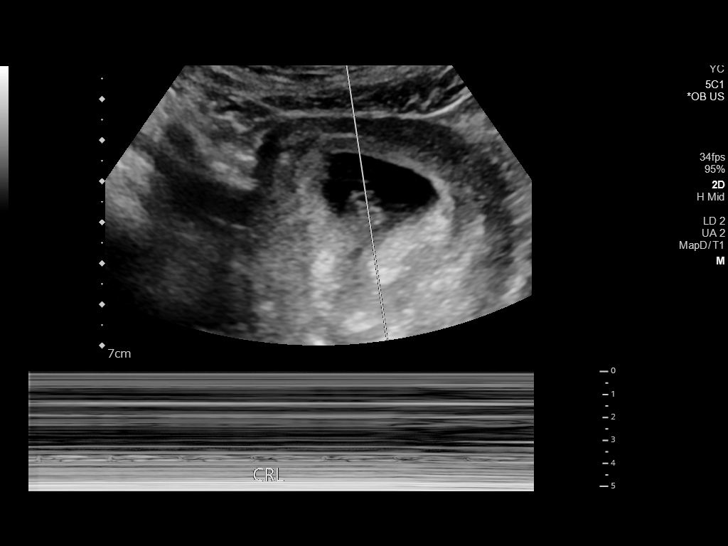
[im 31/50]
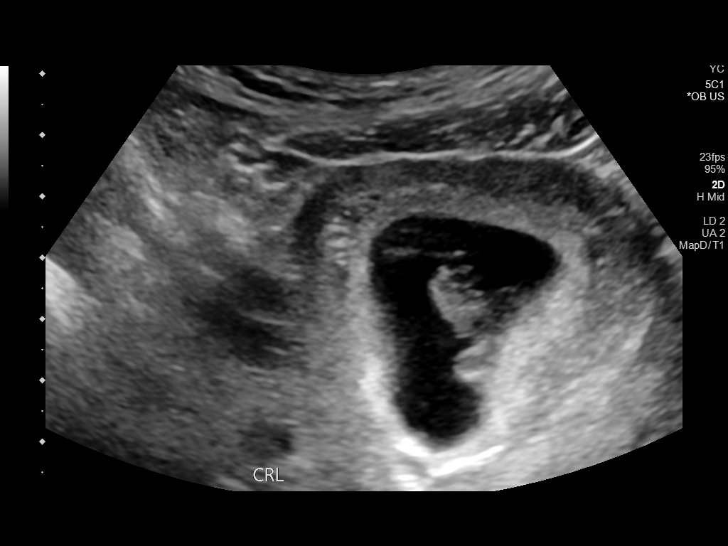
[im 35/50]
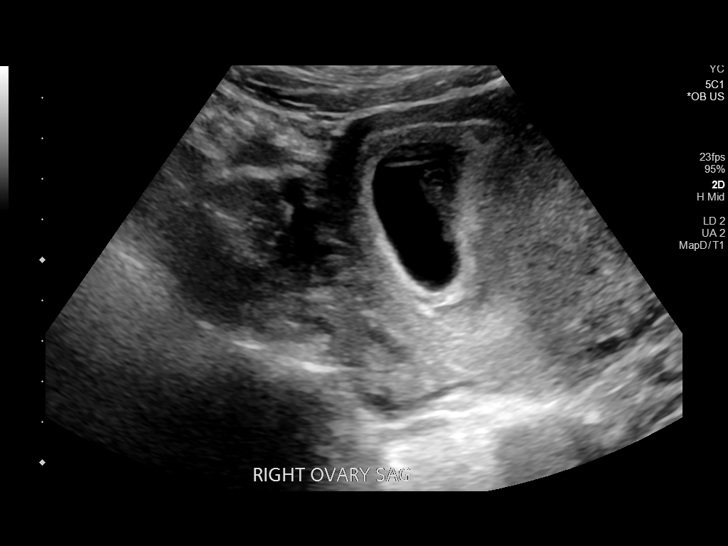
[im 39/50]
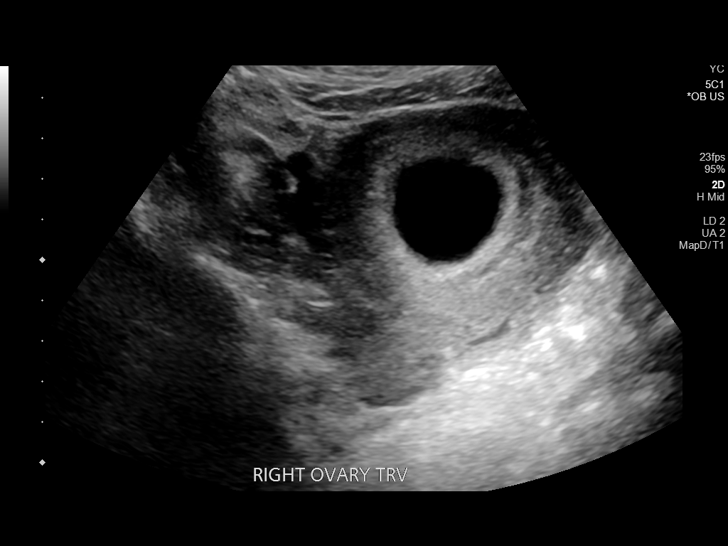
[im 42/50]
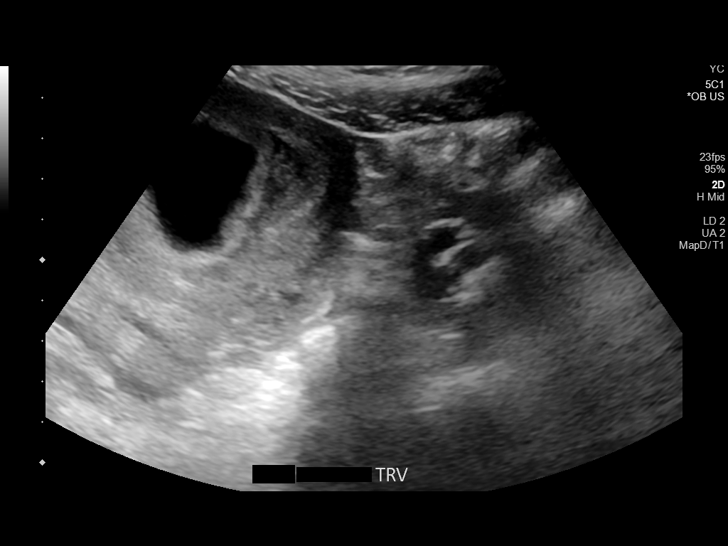
[im 46/50]
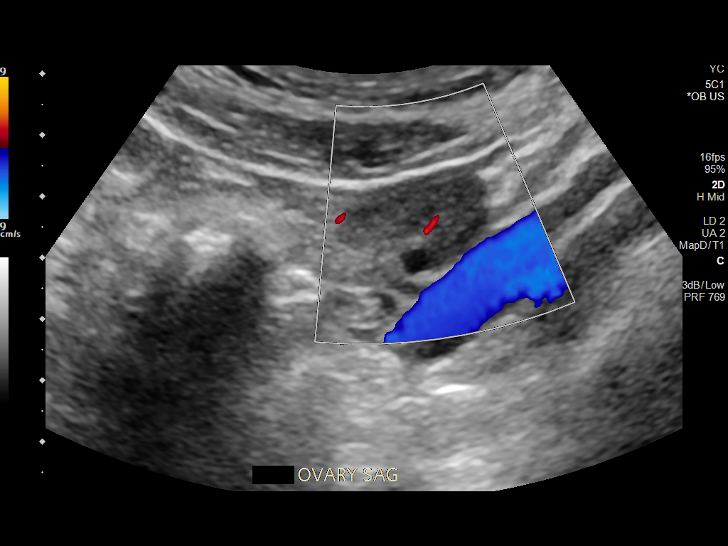
[im 50/50]
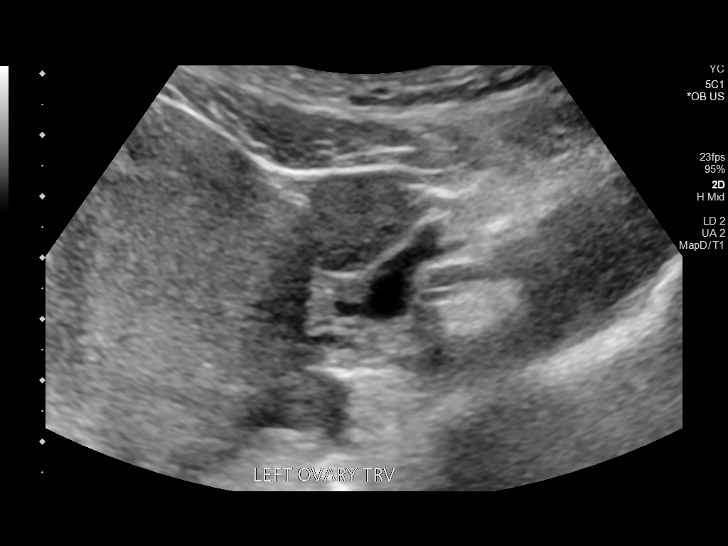

[14 of 28 positions shown; findings below may reference images not displayed]

FINDINGS: Intrauterine gestational sac: Single

Yolk sac:  Present

Embryo:  Present

Cardiac Activity: Present

Heart Rate: 143 bpm

Delete that

CRL:  13.7 mm 7 w 5 d                  [place] EDC: 02/15/2020.

Subchorionic hemorrhage:  None visualized.

Maternal uterus/adnexae: Ovaries are normal in appearance
bilaterally. No adnexal mass or free fluid.
IMPRESSION: 1. Single viable intrauterine pregnancy as above without
complication, estimated gestational age 7 weeks and 5 days by
crown-rump length, with ultrasound EDC of 02/15/2020.
2. No other acute maternal uterine or adnexal abnormality
identified.
